# Patient Record
Sex: Male | Born: 1970
Health system: Southern US, Community
[De-identification: ages and names within clinical notes are randomized; demographics above are authoritative.]

---

## 2013-04-10 ENCOUNTER — Other Ambulatory Visit: Payer: Self-pay | Admitting: Orthopaedic Surgery

## 2013-04-10 DIAGNOSIS — M545 Low back pain, unspecified: Secondary | ICD-10-CM

## 2013-04-11 ENCOUNTER — Ambulatory Visit
Admission: RE | Admit: 2013-04-11 | Discharge: 2013-04-11 | Disposition: A | Discharge status: Home / Self Care | Payer: BC Managed Care – PPO | Source: Ambulatory Visit | Attending: Orthopaedic Surgery | Admitting: Orthopaedic Surgery

## 2013-04-11 DIAGNOSIS — M545 Low back pain, unspecified: Secondary | ICD-10-CM

## 2017-10-15 ENCOUNTER — Encounter (INDEPENDENT_AMBULATORY_CARE_PROVIDER_SITE_OTHER): Payer: Self-pay | Admitting: Family Medicine

## 2017-10-15 ENCOUNTER — Ambulatory Visit (INDEPENDENT_AMBULATORY_CARE_PROVIDER_SITE_OTHER): Payer: 59 | Admitting: Family Medicine

## 2017-10-15 DIAGNOSIS — M545 Low back pain, unspecified: Secondary | ICD-10-CM

## 2017-10-15 NOTE — Progress Notes (Signed)
   Office Visit Note   Patient: Phillip Green           Date of Birth: 07-Jul-1970           MRN: [id number] Visit Date: 10/15/2017 Requested by: Creola Cornusso, John, MD [address] [place], [place] [id number] PCP: Creola Cornusso, John, MD  Subjective: Chief Complaint  Patient presents with  . Lower Back - Pain    Pain x 3 days, no improvement after stretching exercises. Has had flareups like this in the past. Waking up with pain.    HPI: He is here with left-sided low back pain.  Symptoms started a couple days ago, no injury.  Severe pain when transitioning from sitting to standing and vice versa.  No radicular symptoms.  Pain is localized to the left of the lumbosacral area.  Ibuprofen gives a little bit of relief.  He has done well for the past few years.  In the past he had a disc protrusion which caused sciatica symptoms.  He underwent physical therapy and injections, but eventually got relief after going to a chiropractor for couple months.              ROS: Noncontributory.  Objective: Vital Signs: There were no vitals taken for this visit.  Physical Exam:  Back: No visible rash.  He has a tender joint mouse near the left SI joint which seems to reproduce his pain.  No pain in the sciatic notch.  Lower extremity strength and reflexes are normal.  Imaging: None today.  Assessment & Plan: 1.  Left low back pain, suspect sacroiliac dysfunction or possibly adiposis dolorosa - Trial of Vitamin D, Turmeric for inflammation. - Trial of laser therapy. -X-rays if pain persists, possibly MRI of SI joints.   Follow-Up Instructions: No follow-ups on file.       Procedures: None today.   PMFS History: There are no active problems to display for this patient.  No past medical history on file.  No family history on file.   Social History   Occupational History  . Not on file  Tobacco Use  . Smoking status: Former Games developermoker  . Smokeless tobacco: Never Used  . Tobacco comment: smoked as a  teenager  Substance and Sexual Activity  . Alcohol use: Yes    Alcohol/week: 10.0 standard drinks    Types: 10 Cans of beer per week  . Drug use: Never  . Sexual activity: Not on file

## 2017-10-18 ENCOUNTER — Telehealth (INDEPENDENT_AMBULATORY_CARE_PROVIDER_SITE_OTHER): Payer: Self-pay | Admitting: Physician Assistant

## 2017-10-18 NOTE — Telephone Encounter (Signed)
Returned call to patient left message for call back °

## 2017-10-21 ENCOUNTER — Ambulatory Visit (INDEPENDENT_AMBULATORY_CARE_PROVIDER_SITE_OTHER): Payer: Self-pay | Admitting: Physician Assistant

## 2017-12-24 ENCOUNTER — Other Ambulatory Visit (INDEPENDENT_AMBULATORY_CARE_PROVIDER_SITE_OTHER): Payer: Self-pay | Admitting: Family Medicine

## 2017-12-24 ENCOUNTER — Telehealth (INDEPENDENT_AMBULATORY_CARE_PROVIDER_SITE_OTHER): Payer: Self-pay | Admitting: Family Medicine

## 2017-12-24 MED ORDER — METHOCARBAMOL 750 MG PO TABS: 750.0000 mg | ORAL_TABLET | Freq: Four times a day (QID) | ORAL | 6 refills | Status: DC | PRN

## 2017-12-24 NOTE — Telephone Encounter (Signed)
Patient states when he saw Dr Prince RomeHilts on 10/15/17, he was offered a Rx for Methocarbamol and he declined the Rx. Patient still having ongoing problems and would like the Rx for Methocarbamol if possible.  Pharmacy [facility]  On [address] Pt's call back # [phone]

## 2017-12-24 NOTE — Telephone Encounter (Signed)
Advised patient

## 2017-12-24 NOTE — Telephone Encounter (Signed)
Please advise 

## 2017-12-24 NOTE — Telephone Encounter (Signed)
Rx sent 

## 2018-03-19 ENCOUNTER — Other Ambulatory Visit (INDEPENDENT_AMBULATORY_CARE_PROVIDER_SITE_OTHER): Payer: Self-pay | Admitting: Specialist

## 2018-03-19 MED ORDER — TRAMADOL HCL 50 MG PO TABS: 50.0000 mg | ORAL_TABLET | Freq: Four times a day (QID) | ORAL | 0 refills | Status: DC | PRN

## 2018-03-19 MED ORDER — METHYLPREDNISOLONE 4 MG PO TBPK: ORAL_TABLET | ORAL | 0 refills | Status: DC

## 2018-03-19 NOTE — Progress Notes (Signed)
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 A recent episode that has him laid up. [place] controlled substance website interrogated and is negative for any narcotic medication. I discussed cause of the pain, it is central to the back and buttock. There is no leg pain. I will try a 6 day dose pak and provide a few tramadol to decrease the pain. He will contact out office for follow up if no improvement.

## 2018-06-22 ENCOUNTER — Encounter: Payer: Self-pay | Admitting: Family Medicine

## 2018-06-22 ENCOUNTER — Other Ambulatory Visit: Payer: Self-pay

## 2018-06-22 ENCOUNTER — Ambulatory Visit (INDEPENDENT_AMBULATORY_CARE_PROVIDER_SITE_OTHER): Payer: BLUE CROSS/BLUE SHIELD | Admitting: Family Medicine

## 2018-06-22 DIAGNOSIS — M545 Low back pain, unspecified: Secondary | ICD-10-CM

## 2018-06-22 MED ORDER — METHYLPREDNISOLONE 4 MG PO TBPK: ORAL_TABLET | ORAL | 3 refills | Status: DC

## 2018-06-22 NOTE — Progress Notes (Signed)
   Office Visit Note   Patient: Phillip Green           Date of Birth: [birth year]           MRN: [id number] Visit Date: 06/22/2018 Requested by: Creola Corn, MD [address] [place], [place] [address] PCP: Creola Corn, MD  Subjective: Chief Complaint  Patient presents with  . Lower Back - Pain    HPI: He is here with recurrent left sacroiliac area pain.  Last fall he continued to have pain for several months and then eventually took a Medrol Dosepak and the pain went away.  He was fine for a couple months but this past Friday it flared up again without warning.  Now it is severe again, he cannot sleep, it hurts when transitioning from sitting to standing.  He had to leave work today because of his pain.  He is very frustrated by his ongoing troubles and lack of diagnosis.  Denies any sciatica pain.              ROS: No fevers or chills or respiratory symptoms.  All other systems were reviewed and are negative.  Objective: Vital Signs: There were no vitals taken for this visit.  Physical Exam:  General:  Alert and oriented, in no acute distress. Pulm:  Breathing unlabored. Psy:  Normal mood, congruent affect. Skin: No rash on his skin. Low back: No tenderness midline lumbar spine.  Exquisitely tender near the superior aspect of the left SI joint, but the tenderness is actually just lateral to the joint.  There is a probable joint mouse in that area that does not seem to me tender to direct palpation.  No pain in the sciatic notch, negative straight leg raise, lower extremity strength and reflexes are normal.  Imaging: None today.  Assessment & Plan: 1.  Chronic recurrent left SI joint area pain, etiology uncertain but question sacroiliitis. -We will attempt to get an MRI scan done rapidly while he is currently having a flareup. -Once he has his MRI scan he will take a Medrol Dosepak for symptom relief.     Procedures: No procedures performed  No notes on file     PMFS  History: There are no active problems to display for this patient.  History reviewed. No pertinent past medical history.  History reviewed. No pertinent family history.  History reviewed. No pertinent surgical history. Social History   Occupational History  . Not on file  Tobacco Use  . Smoking status: Former Games developer  . Smokeless tobacco: Never Used  . Tobacco comment: smoked as a teenager  Substance and Sexual Activity  . Alcohol use: Yes    Alcohol/week: 10.0 standard drinks    Types: 10 Cans of beer per week  . Drug use: Never  . Sexual activity: Not on file

## 2018-06-24 ENCOUNTER — Telehealth: Payer: Self-pay | Admitting: Family Medicine

## 2018-06-24 ENCOUNTER — Other Ambulatory Visit: Payer: Self-pay

## 2018-06-24 ENCOUNTER — Ambulatory Visit
Admission: RE | Admit: 2018-06-24 | Discharge: 2018-06-24 | Disposition: A | Discharge status: Home / Self Care | Payer: BLUE CROSS/BLUE SHIELD | Source: Ambulatory Visit | Attending: Family Medicine | Admitting: Family Medicine

## 2018-06-24 DIAGNOSIS — M545 Low back pain, unspecified: Secondary | ICD-10-CM

## 2018-06-24 NOTE — Telephone Encounter (Signed)
MRI SI joints looks normal.  Unfortunately, no good explanation for the pain.  Notified pt.

## 2018-06-29 ENCOUNTER — Telehealth: Payer: Self-pay | Admitting: Family Medicine

## 2018-06-29 NOTE — Telephone Encounter (Signed)
He is wanting the results of his MRI.

## 2018-06-29 NOTE — Telephone Encounter (Signed)
I called and discussed with patient

## 2018-06-29 NOTE — Telephone Encounter (Signed)
I called pt and set him up with mychart.  He is asking if Dr. Prince Rome can call him tomorrow around 9:20.  Pt said he has set some time aside for him to call him.

## 2018-07-02 ENCOUNTER — Other Ambulatory Visit: Payer: BLUE CROSS/BLUE SHIELD

## 2018-07-02 ENCOUNTER — Telehealth: Payer: Self-pay | Admitting: Internal Medicine

## 2018-07-02 DIAGNOSIS — Z20822 Contact with and (suspected) exposure to covid-19: Secondary | ICD-10-CM

## 2018-07-02 NOTE — Addendum Note (Signed)
Addended by: Neoma Laming on: 07/02/2018 12:27 PM   Modules accepted: Orders

## 2018-07-02 NOTE — Telephone Encounter (Signed)
Called patient regarding possible exposure to covid-19 and to schedule an appointment. Pt agreed to be tested today at the [facility] test site at 2 pm. He is advised that this is a drive thru and to wear a mask, stay in the car with windows rolled up. Pt voiced understanding.

## 2018-07-04 LAB — NOVEL CORONAVIRUS, NAA: SARS-CoV-2, NAA: NOT DETECTED

## 2019-03-17 DIAGNOSIS — Z85828 Personal history of other malignant neoplasm of skin: Secondary | ICD-10-CM | POA: Diagnosis not present

## 2019-03-17 DIAGNOSIS — L738 Other specified follicular disorders: Secondary | ICD-10-CM | POA: Diagnosis not present

## 2019-03-17 DIAGNOSIS — D2272 Melanocytic nevi of left lower limb, including hip: Secondary | ICD-10-CM | POA: Diagnosis not present

## 2019-03-17 DIAGNOSIS — D225 Melanocytic nevi of trunk: Secondary | ICD-10-CM | POA: Diagnosis not present

## 2019-03-17 DIAGNOSIS — L820 Inflamed seborrheic keratosis: Secondary | ICD-10-CM | POA: Diagnosis not present

## 2019-06-06 DIAGNOSIS — Z20828 Contact with and (suspected) exposure to other viral communicable diseases: Secondary | ICD-10-CM | POA: Diagnosis not present

## 2019-06-06 DIAGNOSIS — J101 Influenza due to other identified influenza virus with other respiratory manifestations: Secondary | ICD-10-CM | POA: Diagnosis not present

## 2019-06-06 DIAGNOSIS — Z20822 Contact with and (suspected) exposure to covid-19: Secondary | ICD-10-CM | POA: Diagnosis not present

## 2019-10-02 ENCOUNTER — Telehealth: Payer: Self-pay | Admitting: Family Medicine

## 2019-10-02 NOTE — Telephone Encounter (Signed)
Yes, email is fine.  He actually sent documents to my [facility] email over the weekend.

## 2019-10-02 NOTE — Telephone Encounter (Signed)
Patient called.   He is trying to send some documents to Dr. Prince Rome but his methods are limited to either email or MyChart. He said his MyChart is not working and asked if he could email Dr.Hilts instead. I told him I would have to ask.   Call back: [phone]

## 2019-10-02 NOTE — Telephone Encounter (Signed)
Please advise 

## 2019-10-02 NOTE — Telephone Encounter (Signed)
I called and advised in a voice mail.

## 2019-10-04 ENCOUNTER — Telehealth: Payer: Self-pay | Admitting: Radiology

## 2019-10-04 NOTE — Telephone Encounter (Signed)
lmom for pt to call me back to sched NP appt.

## 2019-10-04 NOTE — Telephone Encounter (Signed)
-----   Message from Lavada Mesi, MD sent at 10/02/2019  8:22 AM EDT ----- Regarding: Consult with JN Please schedule consult for chronic back pain.

## 2019-10-13 ENCOUNTER — Telehealth: Payer: Self-pay | Admitting: Specialist

## 2019-10-13 NOTE — Telephone Encounter (Signed)
Patient returned call asked if he can be scheduled for an appointment any date or time from September 18th through October 8th. Patient said to leave appointment date and time on his voicemail or text it to him. The number is [phone]

## 2019-10-16 NOTE — Telephone Encounter (Signed)
Lmom advising he has been Sched for 11/09/19 @ 10:30am

## 2019-10-16 NOTE — Telephone Encounter (Signed)
Sched for 11/09/19 @ 10:30am

## 2019-10-18 ENCOUNTER — Telehealth: Payer: Self-pay | Admitting: Family Medicine

## 2019-10-18 NOTE — Telephone Encounter (Signed)
Phillip Green called asked if Dr Prince Rome would call him, Phillip Green said he has a lot of questions concerning his visit with Dr Otelia Sergeant which is scheduled for 11/09/2019 Phillip Green said there are to many question to write down. The number to contact Phillip Green is [phone]

## 2019-10-19 NOTE — Telephone Encounter (Signed)
Left voice mail asking him to send an email with his questions.

## 2019-10-20 ENCOUNTER — Other Ambulatory Visit: Payer: Self-pay | Admitting: Family Medicine

## 2019-10-20 DIAGNOSIS — G8929 Other chronic pain: Secondary | ICD-10-CM

## 2019-10-20 DIAGNOSIS — M545 Low back pain, unspecified: Secondary | ICD-10-CM

## 2019-10-23 ENCOUNTER — Encounter: Payer: Self-pay | Admitting: Specialist

## 2019-10-23 ENCOUNTER — Other Ambulatory Visit: Payer: Self-pay

## 2019-10-23 ENCOUNTER — Ambulatory Visit: Payer: Self-pay

## 2019-10-23 ENCOUNTER — Ambulatory Visit (INDEPENDENT_AMBULATORY_CARE_PROVIDER_SITE_OTHER): Payer: BLUE CROSS/BLUE SHIELD | Admitting: Specialist

## 2019-10-23 VITALS — BP 157/101 | HR 69 | Ht 70.0 in | Wt 172.6 lb

## 2019-10-23 DIAGNOSIS — M25859 Other specified joint disorders, unspecified hip: Secondary | ICD-10-CM

## 2019-10-23 DIAGNOSIS — M545 Low back pain, unspecified: Secondary | ICD-10-CM

## 2019-10-23 DIAGNOSIS — G8929 Other chronic pain: Secondary | ICD-10-CM

## 2019-10-23 DIAGNOSIS — M5136 Other intervertebral disc degeneration, lumbar region: Secondary | ICD-10-CM

## 2019-10-23 NOTE — Progress Notes (Signed)
Office Visit Note   Patient: Phillip Green           Date of Birth: 03-03-1970           MRN: [id number] Visit Date: 10/23/2019              Requested by: Creola Corn, MD [address] [place],  [place] [address] PCP: Creola Corn, MD   Assessment & Plan: Visit Diagnoses:  1. Chronic low back pain, unspecified back pain laterality, unspecified whether sciatica present   2. Lumbar degenerative disc disease   3. Femoral acetabular impingement      Plan: Avoid frequent bending and stooping  No lifting greater than 10 lbs. May use ice or moist heat for pain. Weight loss is of benefit. Best medication for lumbar disc disease is arthritis medications like motrin, celebrex and naprosyn. Exercise is important to improve your indurance and does allow p Back Exercises The following exercises strengthen the muscles that help to support the trunk and back. They also help to keep the lower back flexible. Doing these exercises can help to prevent back pain or lessen existing pain.  If you have back pain or discomfort, try doing these exercises 2-3 times each day or as told by your health care provider.  As your pain improves, do them once each day, but increase the number of times that you repeat the steps for each exercise (do more repetitions).  To prevent the recurrence of back pain, continue to do these exercises once each day or as told by your health care provider. Do exercises exactly as told by your health care provider and adjust them as directed. It is normal to feel mild stretching, pulling, tightness, or discomfort as you do these exercises, but you should stop right away if you feel sudden pain or your pain gets worse. Exercises Single knee to chest Repeat these steps 3-5 times for each leg: 1. Lie on your back on a firm bed or the floor with your legs extended. 2. Bring one knee to your chest. Your other leg should stay extended and in contact with the floor. 3. Hold your knee  in place by grabbing your knee or thigh with both hands and hold. 4. Pull on your knee until you feel a gentle stretch in your lower back or buttocks. 5. Hold the stretch for 10-30 seconds. 6. Slowly release and straighten your leg. Pelvic tilt Repeat these steps 5-10 times: 1. Lie on your back on a firm bed or the floor with your legs extended. 2. Bend your knees so they are pointing toward the ceiling and your feet are flat on the floor. 3. Tighten your lower abdominal muscles to press your lower back against the floor. This motion will tilt your pelvis so your tailbone points up toward the ceiling instead of pointing to your feet or the floor. 4. With gentle tension and even breathing, hold this position for 5-10 seconds. Cat-cow Repeat these steps until your lower back becomes more flexible: 1. Get into a hands-and-knees position on a firm surface. Keep your hands under your shoulders, and keep your knees under your hips. You may place padding under your knees for comfort. 2. Let your head hang down toward your chest. Contract your abdominal muscles and point your tailbone toward the floor so your lower back becomes rounded like the back of a cat. 3. Hold this position for 5 seconds. 4. Slowly lift your head, let your abdominal muscles relax and point  your tailbone up toward the ceiling so your back forms a sagging arch like the back of a cow. 5. Hold this position for 5 seconds.  Press-ups Repeat these steps 5-10 times: 1. Lie on your abdomen (face-down) on the floor. 2. Place your palms near your head, about shoulder-width apart. 3. Keeping your back as relaxed as possible and keeping your hips on the floor, slowly straighten your arms to raise the top half of your body and lift your shoulders. Do not use your back muscles to raise your upper torso. You may adjust the placement of your hands to make yourself more comfortable. 4. Hold this position for 5 seconds while you keep your back  relaxed. 5. Slowly return to lying flat on the floor.  Bridges Repeat these steps 10 times: 1. Lie on your back on a firm surface. 2. Bend your knees so they are pointing toward the ceiling and your feet are flat on the floor. Your arms should be flat at your sides, next to your body. 3. Tighten your buttocks muscles and lift your buttocks off the floor until your waist is at almost the same height as your knees. You should feel the muscles working in your buttocks and the back of your thighs. If you do not feel these muscles, slide your feet 1-2 inches farther away from your buttocks. 4. Hold this position for 3-5 seconds. 5. Slowly lower your hips to the starting position, and allow your buttocks muscles to relax completely. If this exercise is too easy, try doing it with your arms crossed over your chest. Abdominal crunches Repeat these steps 5-10 times: 1. Lie on your back on a firm bed or the floor with your legs extended. 2. Bend your knees so they are pointing toward the ceiling and your feet are flat on the floor. 3. Cross your arms over your chest. 4. Tip your chin slightly toward your chest without bending your neck. 5. Tighten your abdominal muscles and slowly raise your trunk (torso) high enough to lift your shoulder blades a tiny bit off the floor. Avoid raising your torso higher than that because it can put too much stress on your low back and does not help to strengthen your abdominal muscles. 6. Slowly return to your starting position. Back lifts Repeat these steps 5-10 times: 1. Lie on your abdomen (face-down) with your arms at your sides, and rest your forehead on the floor. 2. Tighten the muscles in your legs and your buttocks. 3. Slowly lift your chest off the floor while you keep your hips pressed to the floor. Keep the back of your head in line with the curve in your back. Your eyes should be looking at the floor. 4. Hold this position for 3-5 seconds. 5. Slowly return  to your starting position. Contact a health care provider if:  Your back pain or discomfort gets much worse when you do an exercise.  Your worsening back pain or discomfort does not lessen within 2 hours after you exercise. If you have any of these problems, stop doing these exercises right away. Do not do them again unless your health care provider says that you can. Get help right away if:  You develop sudden, severe back pain. If this happens, stop doing the exercises right away. Do not do them again unless your health care provider says that you can. This information is not intended to replace advice given to you by your health care provider. Make sure you discuss  any questions you have with your health care provider. Document Revised: 05/26/2018 Document Reviewed: 10/21/2017 Elsevier Patient Education  2020 Elsevier Inc[employer] eople to function better inspite of back pain.  Hemp CBD capsules, amazon.com 5,000-7,000 mg per bottle, 60 capsules per bottle, take one capsule twice a day.  Follow-Up Instructions: No follow-ups on file.  Orders:  Orders Placed This Encounter  Procedures  . XR Lumbar Spine 2-3 Views   No orders of the defined types were placed in this encounter.     Procedures: No procedures performed   Clinical Data: No additional findings.   Subjective: Chief Complaint  Patient presents with  . Lower Back - Follow-up    49 year old male with history of back pain and pain into the sacrum. He has been seen by Dr. Prince Rome dating back to prior to Dr. Prince Rome going to [place]. The pain is episodic, its there everyday 1-2 on scale of 1-10. Lying down with pain and rolling in bed is painful. He has pain with bending and stooping. He doesn't pick up sticks, able to lift laundry and not trying to lift heavy objects.  He is able to do for a short period of  Time but prolong sitting bending or lifting or even prolong  Sleeping is painful. Last episode after a 4 hour flight he fell  pain like needed to stretch. He had increased pain and difficulty moving due to pain in the back, it lasted about a week. Now walking without pain and moving more comfortably. He has pain that is an "11" when transitioning from sitting to standing when painful. Took advil for the pain and it helped. Takes advil for pain or naproxyn, at night to get sleep. He is able to walk, when not hurting. He has some night pain. Sleep study in early 2000 Tossing and turning all night. He is filling disability for this condition 1-2 months ago. He was outside of town with the last episode. He is a  Education officer, community. In the past he was having to get on the floor and stretch. Symptoms have improved with decreasing his work load. Occasional searing tingling in the back of the legs. Today is a good day Sometimes pain into the backs of the legs and into the heel, left side. Left side  Leg pain. He has had 4-5 MRIs.     Review of Systems  Constitutional: Negative for activity change, appetite change, chills, diaphoresis, fatigue, fever and unexpected weight change.  HENT: Negative.  Negative for congestion, dental problem, drooling, ear discharge, ear pain, facial swelling, hearing loss, mouth sores, nosebleeds, postnasal drip, rhinorrhea, sinus pressure, sinus pain, sneezing, sore throat, tinnitus, trouble swallowing and voice change.   Eyes: Negative.  Negative for photophobia, pain, discharge, redness, itching and visual disturbance.  Respiratory: Negative.  Negative for apnea, cough, choking, chest tightness, shortness of breath, wheezing and stridor.   Cardiovascular: Negative.  Negative for chest pain and leg swelling.  Gastrointestinal: Negative.  Negative for abdominal distention, abdominal pain, anal bleeding, blood in stool, constipation, diarrhea, nausea and rectal pain.  Endocrine: Positive for heat intolerance. Negative for cold intolerance, polydipsia and polyphagia.  Genitourinary: Negative.  Negative for  difficulty urinating, dysuria, enuresis, flank pain, frequency and hematuria.  Musculoskeletal: Positive for back pain. Negative for arthralgias, gait problem, joint swelling, myalgias, neck pain and neck stiffness.  Skin: Negative.  Negative for color change, pallor, rash and wound.  Allergic/Immunologic: Positive for environmental allergies.  Neurological: Negative.  Negative for dizziness, tremors,  seizures, syncope, facial asymmetry, speech difficulty, weakness, light-headedness, numbness and headaches.  Hematological: Negative.  Negative for adenopathy. Does not bruise/bleed easily.  Psychiatric/Behavioral: Negative.  Negative for agitation, behavioral problems, confusion, decreased concentration, dysphoric mood, hallucinations, self-injury, sleep disturbance and suicidal ideas. The patient is not nervous/anxious and is not hyperactive.      Objective: Vital Signs: BP (!) 157/101 (BP Location: Left Arm, Patient Position: Sitting)   Pulse 69   Ht 5\' 10"  (1.778 m)   Wt 172 lb 9.6 oz (78.3 kg)   BMI 24.77 kg/m   Physical Exam Constitutional:      Appearance: Normal appearance. He is normal weight.  HENT:     Head: Atraumatic.     Nose: Rhinorrhea present. No congestion.     Mouth/Throat:     Pharynx: No oropharyngeal exudate or posterior oropharyngeal erythema.  Eyes:     Pupils: Pupils are equal, round, and reactive to light.  Pulmonary:     Effort: Pulmonary effort is normal.  Abdominal:     General: Abdomen is flat. There is no distension.     Palpations: Abdomen is soft. There is no mass.  Musculoskeletal:        General: Normal range of motion.     Cervical back: Neck supple.  Skin:    General: Skin is warm and dry.     Ortho Exam  Specialty Comments:  No specialty comments available.  Imaging: XR Lumbar Spine 2-3 Views  Result Date: 10/23/2019 Ap and lateral flexion and extension radiographs demonstrate disc space narrowing L5-S1 with anterior osteophytes and  posterior calcification of the disc. No abnormal motion noted. No fracture or dislocation.    PMFS History: There are no problems to display for this patient.  History reviewed. No pertinent past medical history.  History reviewed. No pertinent family history.  History reviewed. No pertinent surgical history. Social History   Occupational History  . Not on file  Tobacco Use  . Smoking status: Former Games developermoker  . Smokeless tobacco: Never Used  . Tobacco comment: smoked as a teenager  Substance and Sexual Activity  . Alcohol use: Yes    Alcohol/week: 10.0 standard drinks    Types: 10 Cans of beer per week  . Drug use: Never  . Sexual activity: Not on file

## 2019-10-23 NOTE — Patient Instructions (Signed)
Plan: Avoid frequent bending and stooping  No lifting greater than 10 lbs. May use ice or moist heat for pain. Weight loss is of benefit. Best medication for lumbar disc disease is arthritis medications like motrin, celebrex and naprosyn. Exercise is important to improve your indurance and does allow p Back Exercises The following exercises strengthen the muscles that help to support the trunk and back. They also help to keep the lower back flexible. Doing these exercises can help to prevent back pain or lessen existing pain.  If you have back pain or discomfort, try doing these exercises 2-3 times each day or as told by your health care provider.  As your pain improves, do them once each day, but increase the number of times that you repeat the steps for each exercise (do more repetitions).  To prevent the recurrence of back pain, continue to do these exercises once each day or as told by your health care provider. Do exercises exactly as told by your health care provider and adjust them as directed. It is normal to feel mild stretching, pulling, tightness, or discomfort as you do these exercises, but you should stop right away if you feel sudden pain or your pain gets worse. Exercises Single knee to chest Repeat these steps 3-5 times for each leg: 1. Lie on your back on a firm bed or the floor with your legs extended. 2. Bring one knee to your chest. Your other leg should stay extended and in contact with the floor. 3. Hold your knee in place by grabbing your knee or thigh with both hands and hold. 4. Pull on your knee until you feel a gentle stretch in your lower back or buttocks. 5. Hold the stretch for 10-30 seconds. 6. Slowly release and straighten your leg. Pelvic tilt Repeat these steps 5-10 times: 1. Lie on your back on a firm bed or the floor with your legs extended. 2. Bend your knees so they are pointing toward the ceiling and your feet are flat on the floor. 3. Tighten your  lower abdominal muscles to press your lower back against the floor. This motion will tilt your pelvis so your tailbone points up toward the ceiling instead of pointing to your feet or the floor. 4. With gentle tension and even breathing, hold this position for 5-10 seconds. Cat-cow Repeat these steps until your lower back becomes more flexible: 1. Get into a hands-and-knees position on a firm surface. Keep your hands under your shoulders, and keep your knees under your hips. You may place padding under your knees for comfort. 2. Let your head hang down toward your chest. Contract your abdominal muscles and point your tailbone toward the floor so your lower back becomes rounded like the back of a cat. 3. Hold this position for 5 seconds. 4. Slowly lift your head, let your abdominal muscles relax and point your tailbone up toward the ceiling so your back forms a sagging arch like the back of a cow. 5. Hold this position for 5 seconds.  Press-ups Repeat these steps 5-10 times: 1. Lie on your abdomen (face-down) on the floor. 2. Place your palms near your head, about shoulder-width apart. 3. Keeping your back as relaxed as possible and keeping your hips on the floor, slowly straighten your arms to raise the top half of your body and lift your shoulders. Do not use your back muscles to raise your upper torso. You may adjust the placement of your hands to make yourself more  comfortable. 4. Hold this position for 5 seconds while you keep your back relaxed. 5. Slowly return to lying flat on the floor.  Bridges Repeat these steps 10 times: 1. Lie on your back on a firm surface. 2. Bend your knees so they are pointing toward the ceiling and your feet are flat on the floor. Your arms should be flat at your sides, next to your body. 3. Tighten your buttocks muscles and lift your buttocks off the floor until your waist is at almost the same height as your knees. You should feel the muscles working in your  buttocks and the back of your thighs. If you do not feel these muscles, slide your feet 1-2 inches farther away from your buttocks. 4. Hold this position for 3-5 seconds. 5. Slowly lower your hips to the starting position, and allow your buttocks muscles to relax completely. If this exercise is too easy, try doing it with your arms crossed over your chest. Abdominal crunches Repeat these steps 5-10 times: 1. Lie on your back on a firm bed or the floor with your legs extended. 2. Bend your knees so they are pointing toward the ceiling and your feet are flat on the floor. 3. Cross your arms over your chest. 4. Tip your chin slightly toward your chest without bending your neck. 5. Tighten your abdominal muscles and slowly raise your trunk (torso) high enough to lift your shoulder blades a tiny bit off the floor. Avoid raising your torso higher than that because it can put too much stress on your low back and does not help to strengthen your abdominal muscles. 6. Slowly return to your starting position. Back lifts Repeat these steps 5-10 times: 1. Lie on your abdomen (face-down) with your arms at your sides, and rest your forehead on the floor. 2. Tighten the muscles in your legs and your buttocks. 3. Slowly lift your chest off the floor while you keep your hips pressed to the floor. Keep the back of your head in line with the curve in your back. Your eyes should be looking at the floor. 4. Hold this position for 3-5 seconds. 5. Slowly return to your starting position. Contact a health care provider if:  Your back pain or discomfort gets much worse when you do an exercise.  Your worsening back pain or discomfort does not lessen within 2 hours after you exercise. If you have any of these problems, stop doing these exercises right away. Do not do them again unless your health care provider says that you can. Get help right away if:  You develop sudden, severe back pain. If this happens, stop  doing the exercises right away. Do not do them again unless your health care provider says that you can. This information is not intended to replace advice given to you by your health care provider. Make sure you discuss any questions you have with your health care provider. Document Revised: 05/26/2018 Document Reviewed: 10/21/2017 Elsevier Patient Education  2020 Elsevier [employer] eople to function better inspite of back pain.

## 2019-11-09 ENCOUNTER — Ambulatory Visit: Payer: BLUE CROSS/BLUE SHIELD | Admitting: Specialist

## 2019-12-11 ENCOUNTER — Telehealth: Payer: Self-pay | Admitting: Specialist

## 2019-12-11 NOTE — Telephone Encounter (Signed)
Received call from patient needing copy of records. I advised need to complete and sign release form. He stated he will come by and complete.he stated he wanted all records that are available.

## 2020-03-01 ENCOUNTER — Other Ambulatory Visit: Payer: Self-pay | Admitting: Internal Medicine

## 2020-03-01 DIAGNOSIS — R030 Elevated blood-pressure reading, without diagnosis of hypertension: Secondary | ICD-10-CM | POA: Diagnosis not present

## 2020-03-01 DIAGNOSIS — E785 Hyperlipidemia, unspecified: Secondary | ICD-10-CM | POA: Diagnosis not present

## 2020-03-06 ENCOUNTER — Telehealth: Payer: Self-pay | Admitting: Family Medicine

## 2020-03-06 NOTE — Telephone Encounter (Signed)
Please advise 

## 2020-03-06 NOTE — Telephone Encounter (Signed)
Pt called and said his back is in severe pain. Wondering if you can perscibe him something for the pain. He is out of town and wondering if you can send it to the [facility] at [employer] number to the [employer]. His CB is [phone]

## 2020-03-07 MED ORDER — TRAMADOL HCL 50 MG PO TABS: 50.0000 mg | ORAL_TABLET | Freq: Four times a day (QID) | ORAL | 0 refills | Status: DC | PRN

## 2020-03-07 MED ORDER — METHYLPREDNISOLONE 4 MG PO TBPK: ORAL_TABLET | ORAL | 3 refills | Status: DC

## 2020-03-07 NOTE — Telephone Encounter (Signed)
Rx sent 

## 2020-03-07 NOTE — Addendum Note (Signed)
Addended by: Lillia Carmel on: 03/07/2020 07:54 AM   Modules accepted: Orders

## 2020-03-07 NOTE — Telephone Encounter (Signed)
I called and advised the patient Dr. Prince Green sent in something for pain - check with the [facility].

## 2020-03-22 ENCOUNTER — Telehealth: Payer: Self-pay | Admitting: Family Medicine

## 2020-03-22 MED ORDER — PREDNISONE 10 MG PO TABS: ORAL_TABLET | ORAL | 0 refills | Status: DC

## 2020-03-22 NOTE — Addendum Note (Signed)
Addended by: Lillia Carmel on: 03/22/2020 02:57 PM   Modules accepted: Orders

## 2020-03-22 NOTE — Telephone Encounter (Signed)
I called and advised the patient this was sent in to the pharmacy in [place].

## 2020-03-22 NOTE — Telephone Encounter (Signed)
Sent!

## 2020-03-22 NOTE — Addendum Note (Signed)
Addended by: Lillia Carmel on: 03/22/2020 02:58 PM   Modules accepted: Orders

## 2020-03-22 NOTE — Telephone Encounter (Signed)
Patient called. He would like a refill on Prednisone called in to [facility] in [facility]. Their number is [phone]

## 2020-03-22 NOTE — Telephone Encounter (Signed)
Please advise 

## 2020-04-01 ENCOUNTER — Ambulatory Visit
Admission: RE | Admit: 2020-04-01 | Discharge: 2020-04-01 | Disposition: A | Discharge status: Home / Self Care | Payer: No Typology Code available for payment source | Source: Ambulatory Visit | Attending: Internal Medicine | Admitting: Internal Medicine

## 2020-04-01 DIAGNOSIS — E785 Hyperlipidemia, unspecified: Secondary | ICD-10-CM

## 2020-04-02 ENCOUNTER — Other Ambulatory Visit: Payer: Self-pay

## 2020-06-05 ENCOUNTER — Other Ambulatory Visit: Payer: Self-pay

## 2020-06-05 ENCOUNTER — Ambulatory Visit (INDEPENDENT_AMBULATORY_CARE_PROVIDER_SITE_OTHER): Payer: BLUE CROSS/BLUE SHIELD | Admitting: Family Medicine

## 2020-06-05 VITALS — BP 160/90 | Ht 69.0 in | Wt 170.0 lb

## 2020-06-05 DIAGNOSIS — G8929 Other chronic pain: Secondary | ICD-10-CM

## 2020-06-05 DIAGNOSIS — M545 Low back pain, unspecified: Secondary | ICD-10-CM

## 2020-06-05 NOTE — Patient Instructions (Signed)
Start physical therapy as you have scheduled. Your SI joints are still tight - hold stretches for 20-30 seconds at least twice a day for this. Your right leg is a few mm shorter than your left and causing some outturning of this foot. Add small heel lift to right shoe and do the line drills we discussed after you have this - basically each day walk 50 paces with great toe of each foot striking the line. You have lordosis of your low back and I think there hasn't been enough focus on strengthening of your abdominals and obliques leading to imbalance and unfortunately more back pain. Follow up with me in 6 weeks for reevaluation.

## 2020-06-06 ENCOUNTER — Encounter: Payer: Self-pay | Admitting: Family Medicine

## 2020-06-06 NOTE — Progress Notes (Signed)
PCP: Creola Corn, MD  Subjective:   HPI: Patient is a 50 y.o. male here for low back pain.  Patient reports he has long history of low back pain more to the left side (though recently had a couple episodes where right side bothered him). No radiation into extremities. No numbness or tingling with this. Bothers more with long drives and prolonged sitting. He likes to cycle and ski for exercise but finds both of these activities can aggravate this. He has seen physicians in past for the pain. Had MRI of SI joints that was normal. Has had multiple MRIs of lumbar spine though only one we can see in system is from 04/2013 that showed left disc protrusion at L5-S1 with mild left foraminal narrowing. He had ESI of back once without benefit. He did physical therapy for 1.5 years at [facility] with dry needling - didn't seem to be as much focus on strengthening, assessment of imbalances. He has had prednisone dose packs in the past, tried a variety of muscle relaxants.  History reviewed. No pertinent past medical history.  No current outpatient medications on file prior to visit.   No current facility-administered medications on file prior to visit.    History reviewed. No pertinent surgical history.  Allergies  Allergen Reactions  . Sulfa Antibiotics Hives    Social History   Socioeconomic History  . Marital status: Married    Spouse name: Not on file  . Number of children: Not on file  . Years of education: Not on file  . Highest education level: Not on file  Occupational History  . Not on file  Tobacco Use  . Smoking status: Former Games developer  . Smokeless tobacco: Never Used  . Tobacco comment: smoked as a teenager  Substance and Sexual Activity  . Alcohol use: Yes    Alcohol/week: 10.0 standard drinks    Types: 10 Cans of beer per week  . Drug use: Never  . Sexual activity: Not on file  Other Topics Concern  . Not on file  Social History Narrative  . Not on file    Social Determinants of Health   Financial Resource Strain: Not on file  Food Insecurity: Not on file  Transportation Needs: Not on file  Physical Activity: Not on file  Stress: Not on file  Social Connections: Not on file  Intimate Partner Violence: Not on file    History reviewed. No pertinent family history.  BP (!) 160/90   Ht 5\' 9"  (1.753 m)   Wt 170 lb (77.1 kg)   BMI 25.10 kg/m   [facility] 06/05/2020  Frequency of aerobic exercise (# of days/week) 4  Average time in minutes 90  Frequency of strengthening activities (# of days/week) 0    No flowsheet data found.  Review of Systems: See HPI above.     Objective:  Physical Exam:  Gen: NAD, comfortable in exam room  Back: Lordosis noted.  No other gross deformity, scoliosis. No paraspinal or midline TTP.  No midline or bony TTP. FROM though unable to touch toes. Strength LEs 5/5 all muscle groups.   2+ MSRs in patellar and achilles tendons, equal bilaterally. Negative SLRs. Sensation intact to light touch bilaterally.  Bilateral hips: No deformity. FROM with 5/5 strength. No tenderness to palpation. NVI distally. Negative logroll bilaterally Decreased motion both SI joints with faber though no pain. Negative fadir and piriformis stretches.  Right leg slightly shorter than left by about 3-65mm.  Gait: mild outturning of left foot compared to right.  Increased motion of right hip compared to left.   Assessment & Plan:  1. Chronic low back pain - multifactorial.  Imaging has been overall reassuring in past of lumbar spine and SI joints.  Old MRI of lumbar spine showed protrusion at L5-S1 on left but only mild narrowing.  He denies radicular symptoms.  Excessive lordosis, mild leg length inequality, gait change related to this, and SI joint dysfunction likely all contributing to his pain along with lack of focus on core strengthening when he's been in physical therapy (more on  stretching, modalities, dry needling).  He has PT visit scheduled with a different group and will do home exercises on days he doesn't go.  SI stretches.  Small lift to right shoe.  F/u in 6 weeks.

## 2020-06-12 DIAGNOSIS — R293 Abnormal posture: Secondary | ICD-10-CM | POA: Diagnosis not present

## 2020-06-12 DIAGNOSIS — R269 Unspecified abnormalities of gait and mobility: Secondary | ICD-10-CM | POA: Diagnosis not present

## 2020-06-12 DIAGNOSIS — M545 Low back pain, unspecified: Secondary | ICD-10-CM | POA: Diagnosis not present

## 2020-06-12 DIAGNOSIS — M6258 Muscle wasting and atrophy, not elsewhere classified, other site: Secondary | ICD-10-CM | POA: Diagnosis not present

## 2020-06-13 DIAGNOSIS — M6258 Muscle wasting and atrophy, not elsewhere classified, other site: Secondary | ICD-10-CM | POA: Diagnosis not present

## 2020-06-13 DIAGNOSIS — R269 Unspecified abnormalities of gait and mobility: Secondary | ICD-10-CM | POA: Diagnosis not present

## 2020-06-13 DIAGNOSIS — R293 Abnormal posture: Secondary | ICD-10-CM | POA: Diagnosis not present

## 2020-06-13 DIAGNOSIS — M545 Low back pain, unspecified: Secondary | ICD-10-CM | POA: Diagnosis not present

## 2020-06-20 DIAGNOSIS — R293 Abnormal posture: Secondary | ICD-10-CM | POA: Diagnosis not present

## 2020-06-20 DIAGNOSIS — R269 Unspecified abnormalities of gait and mobility: Secondary | ICD-10-CM | POA: Diagnosis not present

## 2020-06-20 DIAGNOSIS — M6258 Muscle wasting and atrophy, not elsewhere classified, other site: Secondary | ICD-10-CM | POA: Diagnosis not present

## 2020-06-20 DIAGNOSIS — M545 Low back pain, unspecified: Secondary | ICD-10-CM | POA: Diagnosis not present

## 2020-06-28 DIAGNOSIS — M6258 Muscle wasting and atrophy, not elsewhere classified, other site: Secondary | ICD-10-CM | POA: Diagnosis not present

## 2020-06-28 DIAGNOSIS — M545 Low back pain, unspecified: Secondary | ICD-10-CM | POA: Diagnosis not present

## 2020-06-28 DIAGNOSIS — R269 Unspecified abnormalities of gait and mobility: Secondary | ICD-10-CM | POA: Diagnosis not present

## 2020-06-28 DIAGNOSIS — R293 Abnormal posture: Secondary | ICD-10-CM | POA: Diagnosis not present

## 2020-07-17 ENCOUNTER — Ambulatory Visit: Payer: BLUE CROSS/BLUE SHIELD | Admitting: Family Medicine

## 2020-10-01 DIAGNOSIS — L918 Other hypertrophic disorders of the skin: Secondary | ICD-10-CM | POA: Diagnosis not present

## 2020-10-01 DIAGNOSIS — L738 Other specified follicular disorders: Secondary | ICD-10-CM | POA: Diagnosis not present

## 2020-10-01 DIAGNOSIS — D225 Melanocytic nevi of trunk: Secondary | ICD-10-CM | POA: Diagnosis not present

## 2020-10-01 DIAGNOSIS — L573 Poikiloderma of Civatte: Secondary | ICD-10-CM | POA: Diagnosis not present

## 2020-10-10 DIAGNOSIS — M6258 Muscle wasting and atrophy, not elsewhere classified, other site: Secondary | ICD-10-CM | POA: Diagnosis not present

## 2020-10-10 DIAGNOSIS — R269 Unspecified abnormalities of gait and mobility: Secondary | ICD-10-CM | POA: Diagnosis not present

## 2020-10-10 DIAGNOSIS — M545 Low back pain, unspecified: Secondary | ICD-10-CM | POA: Diagnosis not present

## 2020-10-10 DIAGNOSIS — R293 Abnormal posture: Secondary | ICD-10-CM | POA: Diagnosis not present

## 2020-10-15 DIAGNOSIS — R269 Unspecified abnormalities of gait and mobility: Secondary | ICD-10-CM | POA: Diagnosis not present

## 2020-10-15 DIAGNOSIS — M545 Low back pain, unspecified: Secondary | ICD-10-CM | POA: Diagnosis not present

## 2020-10-15 DIAGNOSIS — M6258 Muscle wasting and atrophy, not elsewhere classified, other site: Secondary | ICD-10-CM | POA: Diagnosis not present

## 2020-10-15 DIAGNOSIS — R293 Abnormal posture: Secondary | ICD-10-CM | POA: Diagnosis not present

## 2020-10-17 ENCOUNTER — Ambulatory Visit: Payer: BLUE CROSS/BLUE SHIELD | Admitting: Specialist

## 2020-10-17 ENCOUNTER — Telehealth: Payer: Self-pay | Admitting: Specialist

## 2020-10-17 NOTE — Telephone Encounter (Signed)
Patient called advised he tested positive for Covid-19 today. I advised patient I would need to cancel his appointment and reschedule. Patient asked if he can have a virtual appointment over the phone today. Patient asked for a call back as soon as possible concerning his appointment. The number to contact patient is [phone]

## 2020-10-17 NOTE — Telephone Encounter (Signed)
I called and advised that Dr. Otelia Sergeant doesn't do virtual visits. I did scheduled him for 11/20/20 and put him on the cancellation list for 10 days after today

## 2020-10-28 ENCOUNTER — Other Ambulatory Visit: Payer: Self-pay | Admitting: Specialist

## 2020-10-28 NOTE — Telephone Encounter (Signed)
Pt is wanting prednisone dos pack and methocarbamol script.   CB [phone]

## 2020-10-28 NOTE — Addendum Note (Signed)
Addended by: Penne Lash, Otis Dials on: 10/28/2020 02:28 PM   Modules accepted: Orders

## 2020-10-31 MED ORDER — METHYLPREDNISOLONE 4 MG PO TBPK: ORAL_TABLET | ORAL | 0 refills | Status: DC

## 2020-10-31 MED ORDER — METHOCARBAMOL 500 MG PO TABS: 500.0000 mg | ORAL_TABLET | Freq: Four times a day (QID) | ORAL | 0 refills | Status: DC

## 2020-11-20 ENCOUNTER — Ambulatory Visit: Payer: Self-pay

## 2020-11-20 ENCOUNTER — Other Ambulatory Visit: Payer: Self-pay

## 2020-11-20 ENCOUNTER — Ambulatory Visit (INDEPENDENT_AMBULATORY_CARE_PROVIDER_SITE_OTHER): Payer: BLUE CROSS/BLUE SHIELD | Admitting: Specialist

## 2020-11-20 VITALS — BP 163/93 | HR 80 | Ht 69.0 in | Wt 170.0 lb

## 2020-11-20 DIAGNOSIS — G8929 Other chronic pain: Secondary | ICD-10-CM | POA: Diagnosis not present

## 2020-11-20 DIAGNOSIS — M5136 Other intervertebral disc degeneration, lumbar region: Secondary | ICD-10-CM | POA: Diagnosis not present

## 2020-11-20 DIAGNOSIS — M545 Low back pain, unspecified: Secondary | ICD-10-CM | POA: Diagnosis not present

## 2020-11-20 DIAGNOSIS — M51369 Other intervertebral disc degeneration, lumbar region without mention of lumbar back pain or lower extremity pain: Secondary | ICD-10-CM

## 2020-11-20 DIAGNOSIS — M25859 Other specified joint disorders, unspecified hip: Secondary | ICD-10-CM | POA: Diagnosis not present

## 2020-11-20 NOTE — Progress Notes (Signed)
Office Visit Note   Patient: Phillip Green           Date of Birth: [birth year]           MRN: [id number] Visit Date: 11/20/2020              Requested by: Creola Corn, MD [address] [place],  [place] [address] PCP: Creola Corn, MD   Assessment & Plan: Visit Diagnoses:  1. Chronic low back pain, unspecified back pain laterality, unspecified whether sciatica present   2. Lumbar degenerative disc disease   3. Femoral acetabular impingement     Plan: Avoid frequent bending and stooping  No lifting greater than 10 lbs. May use ice or moist heat for pain. Weight loss is of benefit. Best medication for lumbar disc disease is arthritis medications like motrin, celebrex and naprosyn. Exercise is important to improve your indurance and does allow people to function better inspite of back pain.  Follow-Up Instructions: Return in about 1 year (around 11/20/2021).   Orders:  Orders Placed This Encounter  Procedures   XR Lumbar Spine 2-3 Views   No orders of the defined types were placed in this encounter.     Procedures: No procedures performed   Clinical Data: No additional findings.   Subjective: Chief Complaint  Patient presents with   Lower Back - Pain, Follow-up    50 year male with history of lumbosacral DDD. 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 He is having less pain with decreased work status. No bowel or bladder. Takes, advil and had a problem in Feb and took prednisone and it helped.   Review of Systems  Constitutional: Negative.   HENT: Negative.    Eyes: Negative.   Respiratory: Negative.    Cardiovascular: Negative.   Gastrointestinal: Negative.   Endocrine: Negative.   Genitourinary: Negative.   Musculoskeletal: Negative.   Skin: Negative.   Allergic/Immunologic: Negative.   Neurological: Negative.   Hematological: Negative.    Psychiatric/Behavioral: Negative.      Objective: Vital Signs: BP (!) 163/93 (BP Location: Left Arm, Patient Position: Sitting, Cuff Size: Normal) Comment: drank 1 espresso this morning  Pulse 80   Ht 5\' 9"  (1.753 m)   Wt 170 lb (77.1 kg)   BMI 25.10 kg/m   Physical Exam Constitutional:      Appearance: He is well-developed.  HENT:     Head: Normocephalic and atraumatic.  Eyes:     Pupils: Pupils are equal, round, and reactive to light.  Pulmonary:     Effort: Pulmonary effort is normal.     Breath sounds: Normal breath sounds.  Abdominal:     General: Bowel sounds are normal.     Palpations: Abdomen is soft.  Musculoskeletal:     Cervical back: Normal range of motion and neck supple.     Lumbar back: Negative right straight leg raise test.  Skin:    General: Skin is warm and dry.  Neurological:     Mental Status: He is alert and oriented to person, place, and time.  Psychiatric:        Behavior: Behavior normal.        Thought Content: Thought content normal.        Judgment: Judgment normal.   Back Exam   Tenderness  The patient is experiencing tenderness in the lumbar.  Range of Motion  Extension:  normal  Flexion:  abnormal  Lateral bend right:  abnormal  Lateral bend left:  abnormal  Rotation right:  abnormal  Rotation left:  abnormal   Muscle Strength  Right Quadriceps:  5/5  Left Quadriceps:  5/5  Right Hamstrings:  5/5  Left Hamstrings:  5/5   Tests  Straight leg raise right: negative  Reflexes  Patellar:  2/4 Achilles:  2/4  Other  Toe walk: normal Heel walk: normal Sensation: normal    Specialty Comments:  No specialty comments available.  Imaging: XR Lumbar Spine 2-3 Views  Result Date: 11/20/2020 Ap and lateral flexion and extension radiographs of the lumbar spine demonstrate narrowing of the L5-S1 disc with angerior lip osteophytes and mild osteophytes at the upper 3 lumbar segments. The disc height is decreased by about 75%.     PMFS History: There are no problems to display for this patient.  No past medical history on file.  No family history on file.  No past surgical history on file. Social History   Occupational History   Not on file  Tobacco Use   Smoking status: Former   Smokeless tobacco: Never   Tobacco comments:    smoked as a teenager  Substance and Sexual Activity   Alcohol use: Yes    Alcohol/week: 10.0 standard drinks    Types: 10 Cans of beer per week   Drug use: Never   Sexual activity: Not on file

## 2020-11-20 NOTE — Patient Instructions (Signed)
Avoid frequent bending and stooping  No lifting greater than 10 lbs. May use ice or moist heat for pain. Weight loss is of benefit. Best medication for lumbar disc disease is arthritis medications like motrin, celebrex and naprosyn. Exercise is important to improve your indurance and does allow people to function better inspite of back pain.   

## 2020-12-05 DIAGNOSIS — E785 Hyperlipidemia, unspecified: Secondary | ICD-10-CM | POA: Diagnosis not present

## 2020-12-05 DIAGNOSIS — Z125 Encounter for screening for malignant neoplasm of prostate: Secondary | ICD-10-CM | POA: Diagnosis not present

## 2020-12-05 DIAGNOSIS — Z0000 Encounter for general adult medical examination without abnormal findings: Secondary | ICD-10-CM | POA: Diagnosis not present

## 2020-12-09 DIAGNOSIS — Z23 Encounter for immunization: Secondary | ICD-10-CM | POA: Diagnosis not present

## 2020-12-09 DIAGNOSIS — E785 Hyperlipidemia, unspecified: Secondary | ICD-10-CM | POA: Diagnosis not present

## 2020-12-09 DIAGNOSIS — R82998 Other abnormal findings in urine: Secondary | ICD-10-CM | POA: Diagnosis not present

## 2020-12-09 DIAGNOSIS — Z0000 Encounter for general adult medical examination without abnormal findings: Secondary | ICD-10-CM | POA: Diagnosis not present

## 2020-12-09 DIAGNOSIS — Z1389 Encounter for screening for other disorder: Secondary | ICD-10-CM | POA: Diagnosis not present

## 2020-12-09 DIAGNOSIS — Z1212 Encounter for screening for malignant neoplasm of rectum: Secondary | ICD-10-CM | POA: Diagnosis not present

## 2020-12-09 DIAGNOSIS — Z1331 Encounter for screening for depression: Secondary | ICD-10-CM | POA: Diagnosis not present

## 2021-02-09 IMAGING — MR MR SACRUM WITHOUT CONTRAST
4 of 7 series · 19 of 48 positions shown · non-contrast
Comparison: MR lumbar spine dated April 11, 2013. MRI sacrum dated
May 06, 2012.

CLINICAL DATA: Left-sided low back pain for the past week.

EXAM:
MRI SACRUM WITHOUT CONTRAST
TECHNIQUE: Multiplanar, multisequence MR imaging of the sacrum was performed.
No intravenous contrast was administered.

[Series 5: T1 · axial · 4.0mm · 0.59mm/px · z∈[-96,+99]mm · 9 of 40 slices shown (1 of 3)]
[im 1/40]
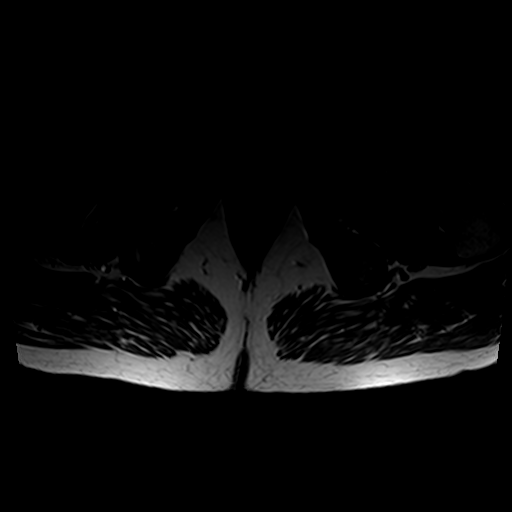
[im 5/40]
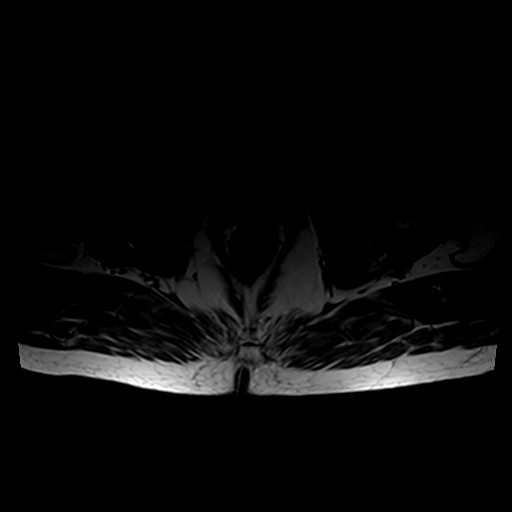
[im 10/40]
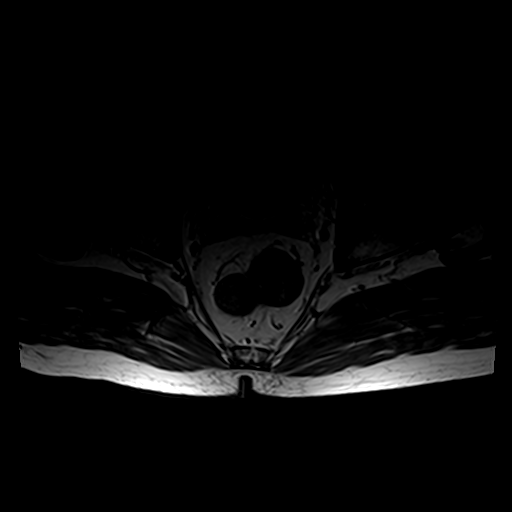
[im 15/40]
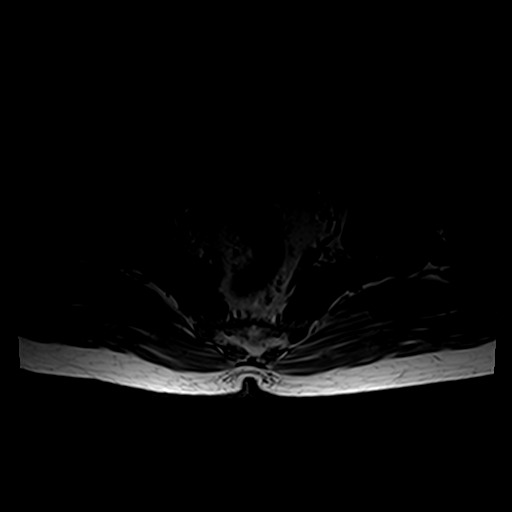
[im 20/40]
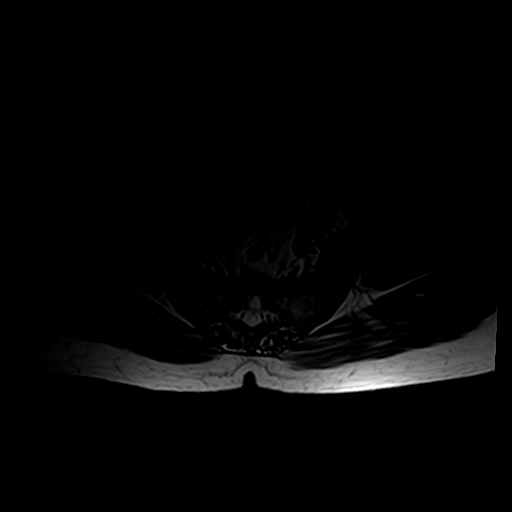
[im 25/40]
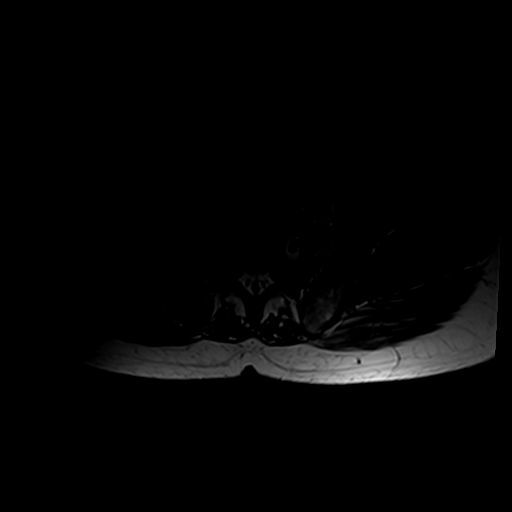
[im 30/40]
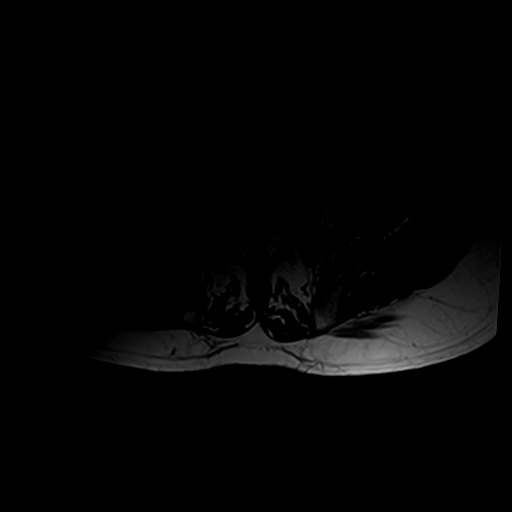
[im 35/40]
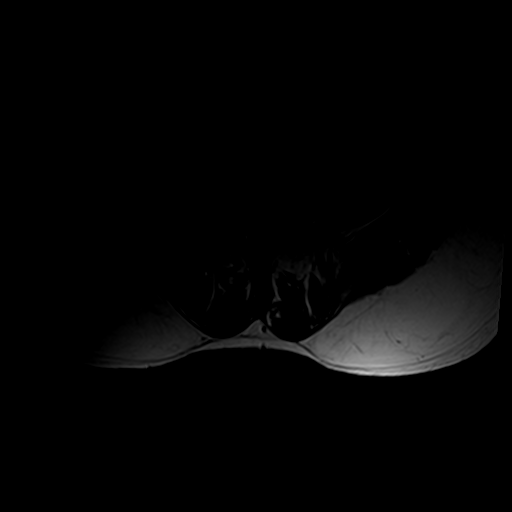
[im 40/40]
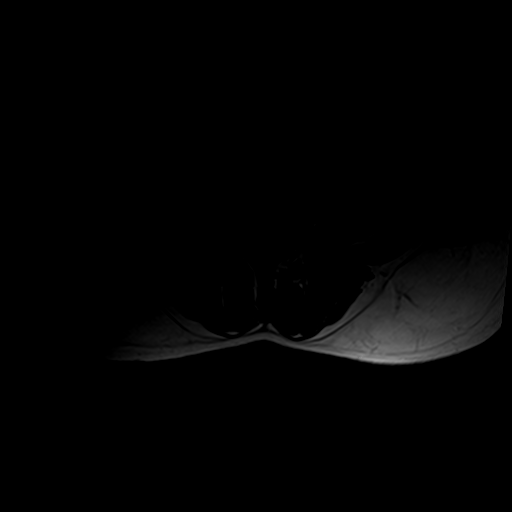

[Series 7: T1 · oblique · 3.0mm · 0.49mm/px · 4 of 20 slices shown (2 of 3)]
[im 1/20]
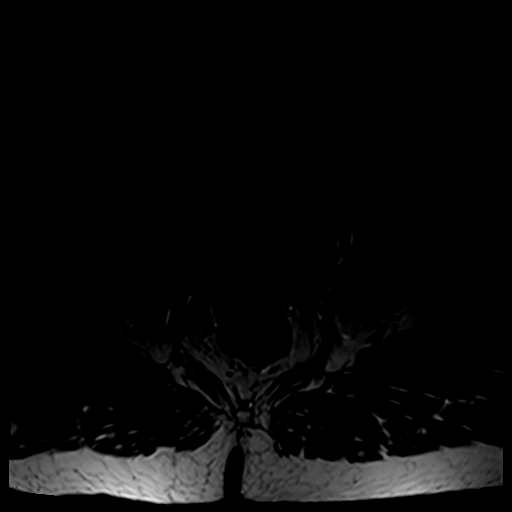
[im 7/20]
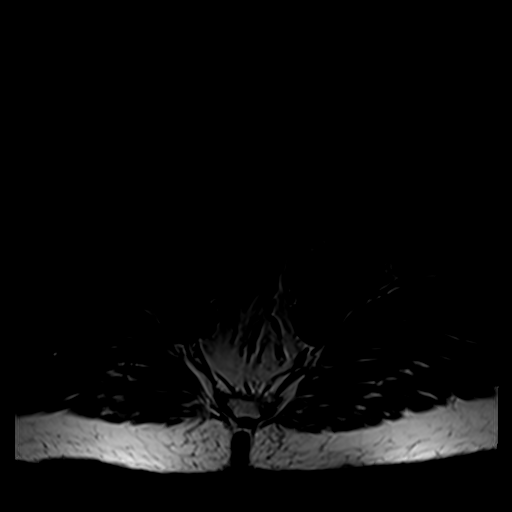
[im 13/20]
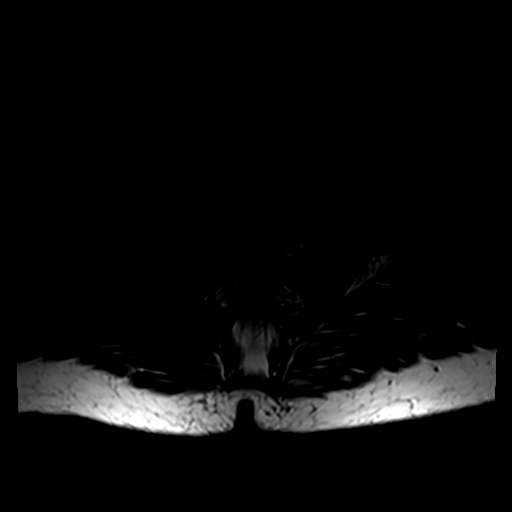
[im 20/20]
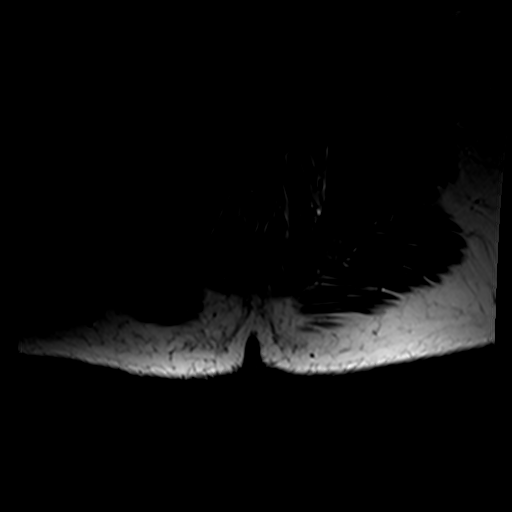

[Series 8: STIR · oblique · 3.0mm · 0.49mm/px · 3 of 20 slices shown]
[im 1/20]
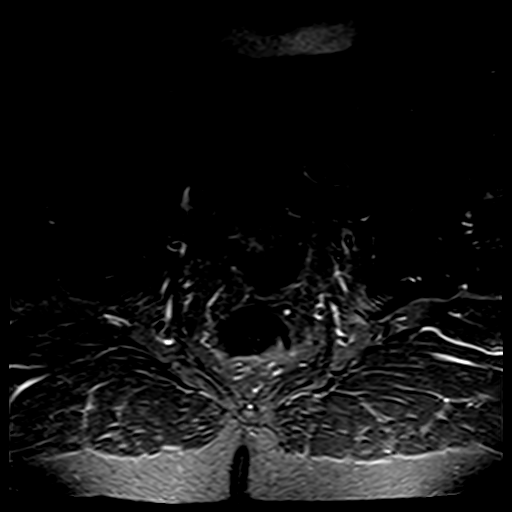
[im 13/20]
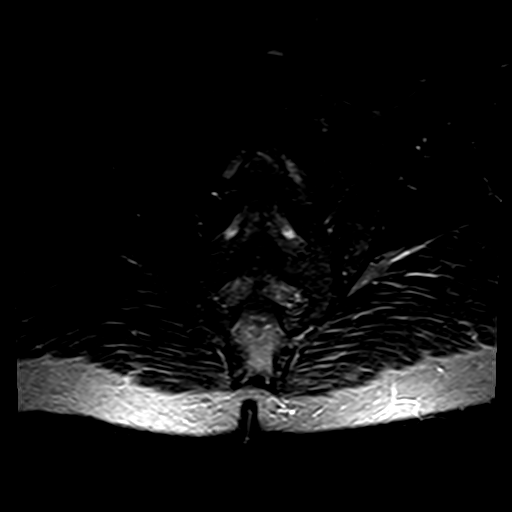
[im 20/20]
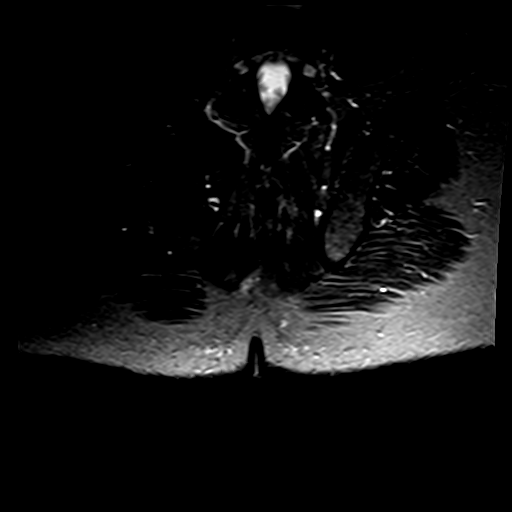

[Series 9: T1 · axial · 4.0mm · 0.59mm/px · z∈[-132,-34]mm · 3 of 34 slices shown (3 of 3)]
[im 6/34]
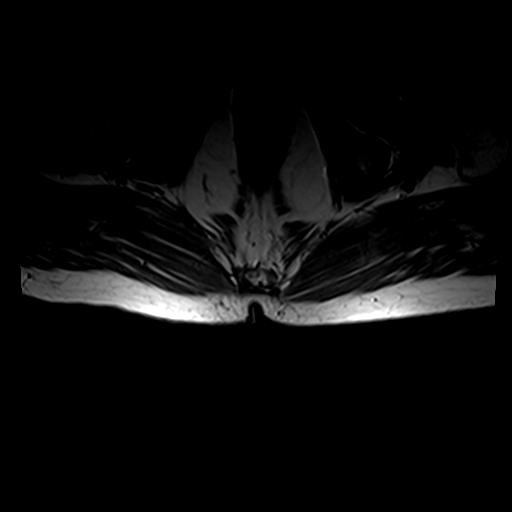
[im 17/34]
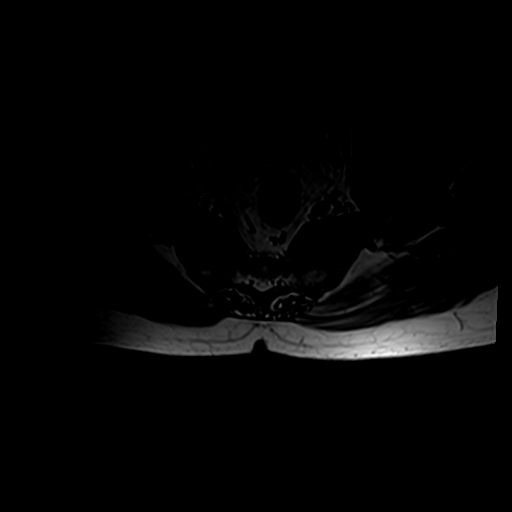
[im 28/34]
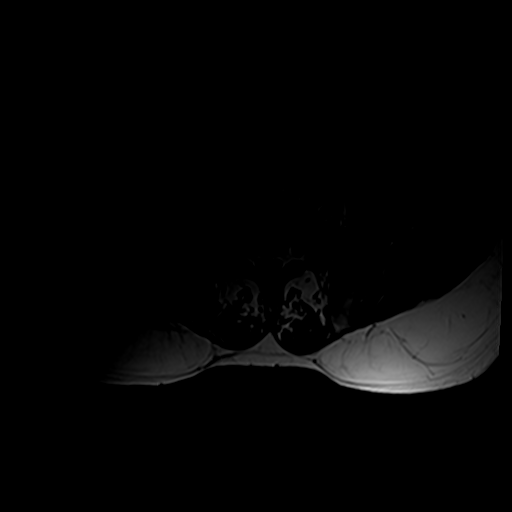

[19 of 48 positions shown; findings below may reference images not displayed]

FINDINGS: Bones/Joint/Cartilage

No suspicious marrow signal abnormality. No fracture or dislocation.
Normal alignment. No joint effusion. Small synovial herniation pit
at the left femoral head/neck junction. Left-sided degenerative
endplate marrow edema and fatty changes at L5-S1, similar to prior
studies.

Muscles and Tendons
Intact.  No muscle edema or atrophy.

Soft tissue
No fluid collection or hematoma.  No soft tissue mass.
IMPRESSION: 1. No acute abnormality.  Normal sacroiliac joints.
2. Chronic left-sided degenerative disc disease at L5-S1.

## 2021-02-13 DIAGNOSIS — M545 Low back pain, unspecified: Secondary | ICD-10-CM | POA: Diagnosis not present

## 2021-02-13 DIAGNOSIS — R269 Unspecified abnormalities of gait and mobility: Secondary | ICD-10-CM | POA: Diagnosis not present

## 2021-02-13 DIAGNOSIS — M2569 Stiffness of other specified joint, not elsewhere classified: Secondary | ICD-10-CM | POA: Diagnosis not present

## 2021-02-13 DIAGNOSIS — M6259 Muscle wasting and atrophy, not elsewhere classified, multiple sites: Secondary | ICD-10-CM | POA: Diagnosis not present

## 2021-02-24 DIAGNOSIS — M6259 Muscle wasting and atrophy, not elsewhere classified, multiple sites: Secondary | ICD-10-CM | POA: Diagnosis not present

## 2021-02-24 DIAGNOSIS — M545 Low back pain, unspecified: Secondary | ICD-10-CM | POA: Diagnosis not present

## 2021-02-24 DIAGNOSIS — R269 Unspecified abnormalities of gait and mobility: Secondary | ICD-10-CM | POA: Diagnosis not present

## 2021-02-24 DIAGNOSIS — M2569 Stiffness of other specified joint, not elsewhere classified: Secondary | ICD-10-CM | POA: Diagnosis not present

## 2021-03-24 DIAGNOSIS — R030 Elevated blood-pressure reading, without diagnosis of hypertension: Secondary | ICD-10-CM | POA: Diagnosis not present

## 2021-03-25 ENCOUNTER — Encounter: Payer: Self-pay | Admitting: Internal Medicine

## 2021-04-21 DIAGNOSIS — Z85828 Personal history of other malignant neoplasm of skin: Secondary | ICD-10-CM | POA: Diagnosis not present

## 2021-04-21 DIAGNOSIS — L570 Actinic keratosis: Secondary | ICD-10-CM | POA: Diagnosis not present

## 2021-04-21 DIAGNOSIS — L738 Other specified follicular disorders: Secondary | ICD-10-CM | POA: Diagnosis not present

## 2021-04-21 DIAGNOSIS — L814 Other melanin hyperpigmentation: Secondary | ICD-10-CM | POA: Diagnosis not present

## 2021-12-24 DIAGNOSIS — Z125 Encounter for screening for malignant neoplasm of prostate: Secondary | ICD-10-CM | POA: Diagnosis not present

## 2021-12-24 DIAGNOSIS — Z1212 Encounter for screening for malignant neoplasm of rectum: Secondary | ICD-10-CM | POA: Diagnosis not present

## 2021-12-24 DIAGNOSIS — E785 Hyperlipidemia, unspecified: Secondary | ICD-10-CM | POA: Diagnosis not present

## 2021-12-30 DIAGNOSIS — R030 Elevated blood-pressure reading, without diagnosis of hypertension: Secondary | ICD-10-CM | POA: Diagnosis not present

## 2021-12-30 DIAGNOSIS — Z23 Encounter for immunization: Secondary | ICD-10-CM | POA: Diagnosis not present

## 2021-12-30 DIAGNOSIS — Z0000 Encounter for general adult medical examination without abnormal findings: Secondary | ICD-10-CM | POA: Diagnosis not present

## 2021-12-30 DIAGNOSIS — R82998 Other abnormal findings in urine: Secondary | ICD-10-CM | POA: Diagnosis not present

## 2021-12-30 DIAGNOSIS — R6882 Decreased libido: Secondary | ICD-10-CM | POA: Diagnosis not present

## 2021-12-30 DIAGNOSIS — Z1389 Encounter for screening for other disorder: Secondary | ICD-10-CM | POA: Diagnosis not present

## 2021-12-30 DIAGNOSIS — Z1331 Encounter for screening for depression: Secondary | ICD-10-CM | POA: Diagnosis not present

## 2022-05-13 DIAGNOSIS — L918 Other hypertrophic disorders of the skin: Secondary | ICD-10-CM | POA: Diagnosis not present

## 2022-05-13 DIAGNOSIS — Z85828 Personal history of other malignant neoplasm of skin: Secondary | ICD-10-CM | POA: Diagnosis not present

## 2022-05-13 DIAGNOSIS — D485 Neoplasm of uncertain behavior of skin: Secondary | ICD-10-CM | POA: Diagnosis not present

## 2022-05-13 DIAGNOSIS — L821 Other seborrheic keratosis: Secondary | ICD-10-CM | POA: Diagnosis not present

## 2022-05-13 DIAGNOSIS — L820 Inflamed seborrheic keratosis: Secondary | ICD-10-CM | POA: Diagnosis not present

## 2022-05-13 DIAGNOSIS — D225 Melanocytic nevi of trunk: Secondary | ICD-10-CM | POA: Diagnosis not present

## 2022-06-23 ENCOUNTER — Other Ambulatory Visit (HOSPITAL_BASED_OUTPATIENT_CLINIC_OR_DEPARTMENT_OTHER): Payer: Self-pay

## 2022-06-23 ENCOUNTER — Other Ambulatory Visit (HOSPITAL_BASED_OUTPATIENT_CLINIC_OR_DEPARTMENT_OTHER): Payer: Self-pay | Admitting: Student

## 2022-06-23 ENCOUNTER — Ambulatory Visit (INDEPENDENT_AMBULATORY_CARE_PROVIDER_SITE_OTHER): Payer: BC Managed Care – PPO

## 2022-06-23 ENCOUNTER — Ambulatory Visit (INDEPENDENT_AMBULATORY_CARE_PROVIDER_SITE_OTHER): Payer: BC Managed Care – PPO | Admitting: Student

## 2022-06-23 DIAGNOSIS — M542 Cervicalgia: Secondary | ICD-10-CM

## 2022-06-23 DIAGNOSIS — M5412 Radiculopathy, cervical region: Secondary | ICD-10-CM | POA: Diagnosis not present

## 2022-06-23 MED ORDER — MELOXICAM 15 MG PO TABS
15.0000 mg | ORAL_TABLET | Freq: Every day | ORAL | 0 refills | Status: AC
  Filled 2022-06-23: qty 14, 14d supply, fill #0

## 2022-06-23 NOTE — Progress Notes (Signed)
Chief Complaint: Right scapular pain     History of Present Illness:    Rigel Koyanagi is a 52 y.o. male presenting today for evaluation of pain in his right scapular and neck region.  He does have significant history of lumbar spine issues consisting of multiple MRIs and for which she is on disability.  He states that his current symptoms began Friday after riding in a car for 3+ hours.  Describes it as a pain in the right upper back/scapular region that radiates down his arm to his wrist.  Describes the pain as a dull ache and on average is moderate in severity.  Reports being able to sleep very little in the past 3 days.  Has been taking Advil consistently throughout the day.  Does feel like he still has full shoulder range of motion.  Patient also states that he is leaving for [place] this upcoming weekend for a few months and will be returning to the area in August.    Surgical History:   None  PMH/PSH/Family History/Social History/Meds/Allergies:   No past medical history on file. No past surgical history on file. Social History   Socioeconomic History   Marital status: Married    Spouse name: Not on file   Number of children: Not on file   Years of education: Not on file   Highest education level: Not on file  Occupational History   Not on file  Tobacco Use   Smoking status: Former   Smokeless tobacco: Never   Tobacco comments:    smoked as a teenager  Substance and Sexual Activity   Alcohol use: Yes    Alcohol/week: 10.0 standard drinks of alcohol    Types: 10 Cans of beer per week   Drug use: Never   Sexual activity: Not on file  Other Topics Concern   Not on file  Social History Narrative   Not on file   Social Determinants of Health   Financial Resource Strain: Not on file  Food Insecurity: Not on file  Transportation Needs: Not on file  Physical Activity: Not on file  Stress: Not on file  Social Connections: Not on file    No family history on file. Allergies  Allergen Reactions   Sulfa Antibiotics Hives   Current Outpatient Medications  Medication Sig Dispense Refill   meloxicam (MOBIC) 15 MG tablet Take 1 tablet (15 mg total) by mouth daily for 14 days. 14 tablet 0   methocarbamol (ROBAXIN) 500 MG tablet Take 1 tablet (500 mg total) by mouth 4 (four) times daily. 60 tablet 0   methylPREDNISolone (MEDROL DOSEPAK) 4 MG TBPK tablet Take as directed 21 tablet 0   No current facility-administered medications for this visit.   No results found.  Review of Systems:   A ROS was performed including pertinent positives and negatives as documented in the HPI.  Physical Exam :   Constitutional: NAD and appears stated age Neurological: Alert and oriented Psych: Appropriate affect and cooperative There were no vitals taken for this visit.   Comprehensive Musculoskeletal Exam:    Cervical spine and paraspinal muscles are nontender to palpation.  Full cervical range of motion with flexion and extension, rotation, and sidebending.  No tenderness throughout the right trapezius or scapula as well as the right shoulder joint.  Positive  Spurling's.  Active shoulder range of motion to 160 degrees forward flexion 45 degrees external rotation and internal rotation to L5 bilaterally.  Bilateral grip strength 5/5 and equal.  Imaging:   Xray (cervical spine 4 views): Negative   I personally reviewed and interpreted the radiographs.   Assessment:   52 y.o. male presenting with right-sided upper back and scapular pain that radiates down the right arm.  Overall I do believe that this is consistent with cervical radiculopathy.  He does not have any notable tightness or trigger points in this reason to suggest a muscular cause.  I would like to get him referred for physical therapy so we will send an external referral that he can take with him to [place].  Also recommend meloxicam which she can substitute for the Advil for  about 2 weeks.  Discussed to have him keep [place] updated on his progress and can reevaluate in August or if he happens to come back sooner.  Can consider if any further evaluation is needed.  Plan :    -Return to clinic in 6 to 8 weeks for follow-up     I personally saw and evaluated the patient, and participated in the management and treatment plan.  Hazle Nordmann, PA-C Orthopedics  This document was dictated using Conservation officer, historic buildings. A reasonable attempt at proof reading has been made to minimize errors.

## 2022-06-24 ENCOUNTER — Telehealth (HOSPITAL_BASED_OUTPATIENT_CLINIC_OR_DEPARTMENT_OTHER): Payer: Self-pay | Admitting: Student

## 2022-06-24 NOTE — Telephone Encounter (Signed)
Patient states that he needs a stronger medicine that what he got on yesterday. Best number [phone]

## 2022-06-24 NOTE — Telephone Encounter (Signed)
Returned call to patient informing of Jackson's response.

## 2022-06-25 ENCOUNTER — Telehealth (HOSPITAL_BASED_OUTPATIENT_CLINIC_OR_DEPARTMENT_OTHER): Payer: Self-pay | Admitting: Student

## 2022-06-25 ENCOUNTER — Other Ambulatory Visit (HOSPITAL_BASED_OUTPATIENT_CLINIC_OR_DEPARTMENT_OTHER): Payer: Self-pay | Admitting: Student

## 2022-06-25 MED ORDER — METHYLPREDNISOLONE 4 MG PO TBPK: ORAL_TABLET | ORAL | 0 refills | Status: DC

## 2022-06-25 NOTE — Telephone Encounter (Signed)
Spoke with pt, he is in a lot of pain and is heading to [place] this weekend. I will send in a medrol dosepak to the pharmacy.

## 2022-06-25 NOTE — Telephone Encounter (Signed)
Question.

## 2022-06-26 ENCOUNTER — Encounter (HOSPITAL_BASED_OUTPATIENT_CLINIC_OR_DEPARTMENT_OTHER): Payer: Self-pay

## 2022-06-30 ENCOUNTER — Telehealth: Payer: Self-pay | Admitting: Student

## 2022-06-30 ENCOUNTER — Other Ambulatory Visit (HOSPITAL_BASED_OUTPATIENT_CLINIC_OR_DEPARTMENT_OTHER): Payer: Self-pay | Admitting: Student

## 2022-06-30 DIAGNOSIS — M542 Cervicalgia: Secondary | ICD-10-CM

## 2022-06-30 NOTE — Telephone Encounter (Signed)
Patient is currently in [place] and needs his referral sent to [facility]  [address], [place], [place] [address] phone number [phone]

## 2022-06-30 NOTE — Telephone Encounter (Signed)
Patient has found a place to get his MRI complete  please send referral to [facility] fax number [phone], please notify me once complete

## 2022-06-30 NOTE — Telephone Encounter (Signed)
Patient would like a call to update Jean Rosenthal on what is going on

## 2022-06-30 NOTE — Telephone Encounter (Signed)
Patient is still trying to get an MRI and the new number for the establishment please disregard last message, PT trying to get correct information

## 2022-07-01 ENCOUNTER — Telehealth: Payer: Self-pay

## 2022-07-01 NOTE — Telephone Encounter (Signed)
Per Phillip Green. He talked to the pt. Pt is wanting someone to follow-up on his mri that was faxed to make sure [facility] received his MRI order. Can you help with this please?

## 2022-07-01 NOTE — Telephone Encounter (Signed)
Spoke to pt regarding MRI order and pre-authorization. I will make sure we have sent over the necessary information.

## 2022-07-01 NOTE — Telephone Encounter (Signed)
THis has been taking care of

## 2022-07-02 ENCOUNTER — Other Ambulatory Visit: Payer: Self-pay | Admitting: Orthopaedic Surgery

## 2022-07-02 ENCOUNTER — Telehealth (HOSPITAL_BASED_OUTPATIENT_CLINIC_OR_DEPARTMENT_OTHER): Payer: Self-pay | Admitting: Orthopaedic Surgery

## 2022-07-02 MED ORDER — GABAPENTIN 100 MG PO CAPS: 100.0000 mg | ORAL_CAPSULE | Freq: Three times a day (TID) | ORAL | 0 refills | Status: DC | PRN

## 2022-07-02 NOTE — Telephone Encounter (Signed)
Patient has received his Mri results and would like to set up a tele visit because he is out of town. Patient is not sure if the results has been sent to Dr B

## 2022-07-02 NOTE — Telephone Encounter (Signed)
Can you please be on the lookout for the MRI results and let me know if you receive them?

## 2022-07-02 NOTE — Telephone Encounter (Signed)
Yes, I will route to you as soon as I see it. Thanks!

## 2022-07-03 ENCOUNTER — Encounter (HOSPITAL_BASED_OUTPATIENT_CLINIC_OR_DEPARTMENT_OTHER): Payer: Self-pay

## 2022-07-06 ENCOUNTER — Telehealth: Payer: Self-pay | Admitting: Student

## 2022-07-06 NOTE — Telephone Encounter (Signed)
Spoke to pt regarding MRI results and treatment plan. Will look for spine specialist to refer him to in [place].

## 2022-07-06 NOTE — Telephone Encounter (Signed)
Patient called asked for a call back concerning his MRI.   The number to contact patient is [phone]

## 2022-07-23 ENCOUNTER — Other Ambulatory Visit (HOSPITAL_COMMUNITY): Payer: Self-pay

## 2022-10-28 DIAGNOSIS — L570 Actinic keratosis: Secondary | ICD-10-CM | POA: Diagnosis not present

## 2022-10-28 DIAGNOSIS — Z85828 Personal history of other malignant neoplasm of skin: Secondary | ICD-10-CM | POA: Diagnosis not present

## 2022-10-28 DIAGNOSIS — L821 Other seborrheic keratosis: Secondary | ICD-10-CM | POA: Diagnosis not present

## 2022-12-08 DIAGNOSIS — L820 Inflamed seborrheic keratosis: Secondary | ICD-10-CM | POA: Diagnosis not present

## 2022-12-08 DIAGNOSIS — Z85828 Personal history of other malignant neoplasm of skin: Secondary | ICD-10-CM | POA: Diagnosis not present

## 2022-12-08 DIAGNOSIS — L570 Actinic keratosis: Secondary | ICD-10-CM | POA: Diagnosis not present

## 2022-12-30 DIAGNOSIS — M216X1 Other acquired deformities of right foot: Secondary | ICD-10-CM | POA: Diagnosis not present

## 2022-12-30 DIAGNOSIS — M7671 Peroneal tendinitis, right leg: Secondary | ICD-10-CM | POA: Diagnosis not present

## 2023-04-06 ENCOUNTER — Encounter: Payer: Self-pay | Admitting: Family Medicine

## 2023-04-06 ENCOUNTER — Ambulatory Visit (INDEPENDENT_AMBULATORY_CARE_PROVIDER_SITE_OTHER): Admitting: Family Medicine

## 2023-04-06 VITALS — BP 136/82 | Ht 69.0 in | Wt 178.0 lb

## 2023-04-06 DIAGNOSIS — M47816 Spondylosis without myelopathy or radiculopathy, lumbar region: Secondary | ICD-10-CM | POA: Insufficient documentation

## 2023-04-06 DIAGNOSIS — M545 Low back pain, unspecified: Secondary | ICD-10-CM | POA: Insufficient documentation

## 2023-04-06 DIAGNOSIS — G8929 Other chronic pain: Secondary | ICD-10-CM | POA: Insufficient documentation

## 2023-04-06 DIAGNOSIS — M7918 Myalgia, other site: Secondary | ICD-10-CM

## 2023-04-06 MED ORDER — PREDNISONE 10 MG PO TABS: ORAL_TABLET | ORAL | 0 refills | Status: AC

## 2023-04-06 NOTE — Progress Notes (Signed)
 DATE OF VISIT: 04/06/2023    Phillip Green DOB: 05-Dec-1970 MRN: [id number]  CC:  RT hip pain  History- Phillip Green is a 53 y.o.  male for evaluation and treatment of hip pain Skiing in [place] about 2 weeks ago - was going down slope with expert - needed to ski down expert terrain -- was applying 110% effort on tough terrain Did not feel a pop Ok that day The next day had increased pain Pain in the right buttock No radiation down the leg No numbness/tingling No bruising or swelling Pain with walking Pain with laying on right side for several days Pain rolling over while sleeping Was doing a lot of walking - needed to walk with straight leg Uncomfortable on flight home Has been taking Advil 800mg  every day prn  Hx chronic back issues - hx lumbar & sacral disc bulge - prior epidural injections -- last ~6 years ago - was having back pain - has been told may need back surgery in the future - currently on disability for back issues - previous physician has retired -- has seen Dr Magnus Ivan - previously on Gabapentin and Robaxin prn -- took gabapentin x 4-5 months, but made him too groggy, could only take at bedtime  Hx Rt peroneal tendonitis  - resolved about 1 month ago - saw Dr Victorino Dike  Past Medical History History reviewed. No pertinent past medical history.  Past Surgical History History reviewed. No pertinent surgical history.  Medications Current Outpatient Medications  Medication Sig Dispense Refill   predniSONE (DELTASONE) 10 MG tablet Use as directed per doctors orders for the next 6 days. 21 tablet 0   No current facility-administered medications for this visit.    Allergies is allergic to sulfa antibiotics.  Family History - reviewed per EMR and intake form  Social History   reports current alcohol use of about 10.0 standard drinks of alcohol per week.  reports that he has quit smoking. He has never used smokeless tobacco.  reports no history of drug  use. OCCUPATION: on disability for chronic back issues   EXAM: Vitals: BP 136/82   Ht 5\' 9"  (1.753 m)   Wt 178 lb (80.7 kg)   BMI 26.29 kg/m  General: AOx3, NAD, pleasant SKIN: no rashes or lesions, skin clean, dry, intact MSK: L-spine: No gross deformity.  No midline tenderness.  Mild bilateral paraspinal tenderness right greater than left.  Mild right-sided SI joint tenderness.  Positive FABER and FADIR on the right with reproducible right buttock pain.  Tight hamstring on the right, but negative straight leg raise.  Negative straight leg raise on the left.  Good range of motion with some pain at terminal flexion and terminal extension.  Able to toe walk and heel walk Hip: Right hip with good range of motion with mild pain at terminal external and internal rotation.  No tenderness to palpation over the anterior hip, greater trochanter, posterior hip.  No tenderness over the ischial tuberosity.  Hip strength 5 -/5 throughout.  Left hip with full range of motion without pain or weakness. Walking without a limp NEURO: sensation intact to light touch, DTR + 2/4 Achilles and patella bilaterally VASC: pulses 2+ and symmetric PT/PT bilaterally, no edema  IMAGING:  Lspine XR 11/20/20 showing: Ap and lateral flexion and extension radiographs of the lumbar spine  demonstrate narrowing of the L5-S1 disc with angerior lip osteophytes and  mild osteophytes at the upper 3 lumbar segments. The disc height is  decreased by  about 75%.    MRI Sacrum 06/24/2018 showing: IMPRESSION: 1. No acute abnormality.  Normal sacroiliac joints. 2. Chronic left-sided degenerative disc disease at L5-S1.  MRI LSPINE 04/11/2013 showing: IMPRESSION  1. Slight progression of leftward disc protrusion at L5-S1 with mild  left foraminal narrowing.   Assessment & Plan Right buttock pain Right buttock pain following intense ski trip in [place], underlying history of chronic low back pain without sciatica and underlying  spondylosis.  Previous epidural injections in the past.  Has also been on gabapentin and muscle relaxants in the past. -Relatively unremarkable exam today -History is suspicious for radicular pain to the right buttock from underlying chronic back issues versus acute muscular injury from skiing  Plan: -Reviewed his imaging studies reviewed in care everywhere and in PACS with findings as noted above -Rx 6-day prednisone taper to help calm down pain and inflammation -Home exercise program given to help stabilize core and low back as well as hips -Heat or ice as needed -Follow-up 2 weeks if no improvement, sooner if worsening.  If symptoms are not improving or worsening would consider updated imaging with x-ray and/or MRI of the lumbar spine Chronic low back pain without sciatica, unspecified back pain laterality History of chronic low back pain without sciatica and underlying spondylosis.  Patient is currently on disability due to chronic back issues.  History of multiple epidurals in the past, also treated with gabapentin and muscle relaxants -Current right buttock symptoms are likely radicular pain/referred pain from his low back versus acute muscular injury from skiing  Plan: -Reviewed his imaging studies reviewed in care everywhere and in PACS with findings as noted above -Rx 6-day prednisone taper to help calm down pain and inflammation -Home exercise program given to help stabilize core and low back as well as hips -Heat or ice as needed -Follow-up 2 weeks if no improvement, sooner if worsening.  If symptoms are not improving or worsening would consider updated imaging with x-ray and/or MRI of the lumbar spine Spondylosis of lumbar region without myelopathy or radiculopathy History of chronic low back pain without sciatica and underlying spondylosis.  Patient is currently on disability due to chronic back issues.  History of multiple epidurals in the past, also treated with gabapentin and  muscle relaxants -Current right buttock symptoms are likely radicular pain/referred pain from his low back versus acute muscular injury from skiing  Plan: -Reviewed his imaging studies reviewed in care everywhere and in PACS with findings as noted above -Rx 6-day prednisone taper to help calm down pain and inflammation -Home exercise program given to help stabilize core and low back as well as hips -Heat or ice as needed -Follow-up 2 weeks if no improvement, sooner if worsening.  If symptoms are not improving or worsening would consider updated imaging with x-ray and/or MRI of the lumbar spine  Patient expressed understanding & agreement with above.  Encounter Diagnoses  Name Primary?   Right buttock pain Yes   Chronic low back pain without sciatica, unspecified back pain laterality    Spondylosis of lumbar region without myelopathy or radiculopathy     No orders of the defined types were placed in this encounter.   No orders of the defined types were placed in this encounter.

## 2023-04-06 NOTE — Assessment & Plan Note (Signed)
 History of chronic low back pain without sciatica and underlying spondylosis.  Patient is currently on disability due to chronic back issues.  History of multiple epidurals in the past, also treated with gabapentin and muscle relaxants -Current right buttock symptoms are likely radicular pain/referred pain from his low back versus acute muscular injury from skiing  Plan: -Reviewed his imaging studies reviewed in care everywhere and in PACS with findings as noted above -Rx 6-day prednisone taper to help calm down pain and inflammation -Home exercise program given to help stabilize core and low back as well as hips -Heat or ice as needed -Follow-up 2 weeks if no improvement, sooner if worsening.  If symptoms are not improving or worsening would consider updated imaging with x-ray and/or MRI of the lumbar spine

## 2023-07-13 DIAGNOSIS — L821 Other seborrheic keratosis: Secondary | ICD-10-CM | POA: Diagnosis not present

## 2023-07-13 DIAGNOSIS — L723 Sebaceous cyst: Secondary | ICD-10-CM | POA: Diagnosis not present

## 2023-07-13 DIAGNOSIS — C44319 Basal cell carcinoma of skin of other parts of face: Secondary | ICD-10-CM | POA: Diagnosis not present

## 2023-07-13 DIAGNOSIS — Z85828 Personal history of other malignant neoplasm of skin: Secondary | ICD-10-CM | POA: Diagnosis not present

## 2023-07-13 DIAGNOSIS — L738 Other specified follicular disorders: Secondary | ICD-10-CM | POA: Diagnosis not present
